# Patient Record
Sex: Male | Born: 1995 | Race: White | Hispanic: No | Marital: Single
Health system: Southern US, Community
[De-identification: ages and names within clinical notes are randomized; demographics above are authoritative.]

## PROBLEM LIST (undated history)

## (undated) ENCOUNTER — Emergency Department (HOSPITAL_COMMUNITY): Admission: EM | Payer: No Typology Code available for payment source | Source: Home / Self Care

## (undated) DIAGNOSIS — F419 Anxiety disorder, unspecified: Secondary | ICD-10-CM

## (undated) DIAGNOSIS — R9431 Abnormal electrocardiogram [ECG] [EKG]: Secondary | ICD-10-CM

## (undated) HISTORY — PX: CIRCUMCISION: SHX1350

## (undated) HISTORY — PX: BACK SURGERY: SHX140

## (undated) HISTORY — DX: Abnormal electrocardiogram (ECG) (EKG): R94.31

## (undated) HISTORY — DX: Anxiety disorder, unspecified: F41.9

---
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
## 2015-05-19 ENCOUNTER — Ambulatory Visit
Admission: RE | Admit: 2015-05-19 | Discharge: 2015-05-19 | Disposition: A | Discharge status: Home / Self Care | Payer: 59 | Source: Ambulatory Visit | Attending: Nurse Practitioner | Admitting: Nurse Practitioner

## 2015-05-19 ENCOUNTER — Other Ambulatory Visit: Payer: Self-pay | Admitting: Nurse Practitioner

## 2015-05-19 DIAGNOSIS — J069 Acute upper respiratory infection, unspecified: Secondary | ICD-10-CM

## 2017-07-28 ENCOUNTER — Emergency Department (HOSPITAL_COMMUNITY)
Admission: EM | Admit: 2017-07-28 | Discharge: 2017-07-29 | Disposition: A | Discharge status: Home / Self Care | Payer: 59 | Attending: Emergency Medicine | Admitting: Emergency Medicine

## 2017-07-28 ENCOUNTER — Other Ambulatory Visit: Payer: Self-pay

## 2017-07-28 DIAGNOSIS — Y9389 Activity, other specified: Secondary | ICD-10-CM | POA: Diagnosis not present

## 2017-07-28 DIAGNOSIS — S29012A Strain of muscle and tendon of back wall of thorax, initial encounter: Secondary | ICD-10-CM | POA: Diagnosis not present

## 2017-07-28 DIAGNOSIS — S81011A Laceration without foreign body, right knee, initial encounter: Secondary | ICD-10-CM | POA: Diagnosis not present

## 2017-07-28 DIAGNOSIS — Y92410 Unspecified street and highway as the place of occurrence of the external cause: Secondary | ICD-10-CM | POA: Diagnosis not present

## 2017-07-28 DIAGNOSIS — S4992XA Unspecified injury of left shoulder and upper arm, initial encounter: Secondary | ICD-10-CM | POA: Insufficient documentation

## 2017-07-28 DIAGNOSIS — S81021A Laceration with foreign body, right knee, initial encounter: Secondary | ICD-10-CM | POA: Insufficient documentation

## 2017-07-28 DIAGNOSIS — Z23 Encounter for immunization: Secondary | ICD-10-CM | POA: Insufficient documentation

## 2017-07-28 DIAGNOSIS — T148XXA Other injury of unspecified body region, initial encounter: Secondary | ICD-10-CM

## 2017-07-28 DIAGNOSIS — S39012A Strain of muscle, fascia and tendon of lower back, initial encounter: Secondary | ICD-10-CM | POA: Diagnosis not present

## 2017-07-28 DIAGNOSIS — Y999 Unspecified external cause status: Secondary | ICD-10-CM | POA: Insufficient documentation

## 2017-07-28 DIAGNOSIS — S8991XA Unspecified injury of right lower leg, initial encounter: Secondary | ICD-10-CM | POA: Diagnosis present

## 2017-07-28 DIAGNOSIS — S299XXA Unspecified injury of thorax, initial encounter: Secondary | ICD-10-CM | POA: Diagnosis not present

## 2017-07-28 NOTE — ED Notes (Signed)
Bed: WA03 Expected date:  Expected time:  Means of arrival:  Comments: 

## 2017-07-28 NOTE — ED Notes (Signed)
Bed: WA06 Expected date:  Expected time:  Means of arrival:  Comments: 
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
## 2017-07-28 NOTE — ED Notes (Signed)
Bed: WA15 Expected date:  Expected time:  Means of arrival:  Comments: 

## 2017-07-29 ENCOUNTER — Emergency Department (HOSPITAL_COMMUNITY): Payer: 59

## 2017-07-29 DIAGNOSIS — S81021A Laceration with foreign body, right knee, initial encounter: Secondary | ICD-10-CM | POA: Diagnosis not present

## 2017-07-29 MED ORDER — CEPHALEXIN 500 MG PO CAPS
500.0000 mg | ORAL_CAPSULE | Freq: Once | ORAL | Status: AC
  Administered 2017-07-29: 500 mg via ORAL
  Filled 2017-07-29: qty 1

## 2017-07-29 MED ORDER — BACITRACIN ZINC 500 UNIT/GM EX OINT
TOPICAL_OINTMENT | Freq: Once | CUTANEOUS | Status: AC
  Administered 2017-07-29: 1 via TOPICAL

## 2017-07-29 MED ORDER — NAPROXEN 375 MG PO TABS
375.0000 mg | ORAL_TABLET | Freq: Two times a day (BID) | ORAL | 0 refills | Status: DC
Start: 2017-07-29 — End: 2019-07-08

## 2017-07-29 MED ORDER — LIDOCAINE-EPINEPHRINE (PF) 2 %-1:200000 IJ SOLN
10.0000 mL | Freq: Once | INTRAMUSCULAR | Status: AC
  Administered 2017-07-29: 10 mL via INTRADERMAL
  Filled 2017-07-29: qty 20

## 2017-07-29 MED ORDER — METHOCARBAMOL 500 MG PO TABS: 500.0000 mg | ORAL_TABLET | Freq: Two times a day (BID) | ORAL | 0 refills | Status: DC

## 2017-07-29 MED ORDER — BACITRACIN ZINC 500 UNIT/GM EX OINT
TOPICAL_OINTMENT | CUTANEOUS | Status: AC
  Administered 2017-07-29: 1 via TOPICAL
  Filled 2017-07-29: qty 0.9

## 2017-07-29 MED ORDER — HYDROCODONE-ACETAMINOPHEN 5-325 MG PO TABS
2.0000 | ORAL_TABLET | Freq: Once | ORAL | Status: AC
  Administered 2017-07-29: 2 via ORAL
  Filled 2017-07-29: qty 2

## 2017-07-29 MED ORDER — METHOCARBAMOL 500 MG PO TABS
500.0000 mg | ORAL_TABLET | Freq: Once | ORAL | Status: AC
  Administered 2017-07-29: 500 mg via ORAL
  Filled 2017-07-29: qty 1

## 2017-07-29 MED ORDER — CEPHALEXIN 500 MG PO CAPS: 500.0000 mg | ORAL_CAPSULE | Freq: Three times a day (TID) | ORAL | 0 refills | Status: AC

## 2017-07-29 MED ORDER — TETANUS-DIPHTH-ACELL PERTUSSIS 5-2.5-18.5 LF-MCG/0.5 IM SUSP
0.5000 mL | Freq: Once | INTRAMUSCULAR | Status: AC
  Administered 2017-07-29: 0.5 mL via INTRAMUSCULAR
  Filled 2017-07-29: qty 0.5

## 2017-07-29 MED ORDER — KETOROLAC TROMETHAMINE 60 MG/2ML IM SOLN
60.0000 mg | Freq: Once | INTRAMUSCULAR | Status: AC
  Administered 2017-07-29: 60 mg via INTRAMUSCULAR
  Filled 2017-07-29: qty 2
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
## 2017-07-29 NOTE — ED Notes (Signed)
Pt requesting tums. PA made aware

## 2017-07-29 NOTE — ED Notes (Signed)
Bacitracin applied to rt knee. Petroleum gauze applied with gauze wrap.

## 2017-07-29 NOTE — ED Notes (Signed)
PA made aware of pt pain remaining unchanged

## 2017-10-20 ENCOUNTER — Ambulatory Visit: Payer: Self-pay | Admitting: Podiatry

## 2019-05-28 ENCOUNTER — Emergency Department (HOSPITAL_COMMUNITY): Payer: 59

## 2019-05-28 ENCOUNTER — Encounter (HOSPITAL_COMMUNITY): Payer: Self-pay | Admitting: *Deleted

## 2019-05-28 ENCOUNTER — Emergency Department (HOSPITAL_COMMUNITY)
Admission: EM | Admit: 2019-05-28 | Discharge: 2019-05-29 | Disposition: A | Discharge status: Home / Self Care | Payer: 59 | Attending: Emergency Medicine | Admitting: Emergency Medicine

## 2019-05-28 DIAGNOSIS — S060X9A Concussion with loss of consciousness of unspecified duration, initial encounter: Secondary | ICD-10-CM

## 2019-05-28 DIAGNOSIS — F17200 Nicotine dependence, unspecified, uncomplicated: Secondary | ICD-10-CM | POA: Diagnosis not present

## 2019-05-28 DIAGNOSIS — S3991XA Unspecified injury of abdomen, initial encounter: Secondary | ICD-10-CM | POA: Diagnosis not present

## 2019-05-28 DIAGNOSIS — Y939 Activity, unspecified: Secondary | ICD-10-CM | POA: Insufficient documentation

## 2019-05-28 DIAGNOSIS — S0101XA Laceration without foreign body of scalp, initial encounter: Secondary | ICD-10-CM

## 2019-05-28 DIAGNOSIS — Y92410 Unspecified street and highway as the place of occurrence of the external cause: Secondary | ICD-10-CM | POA: Insufficient documentation

## 2019-05-28 DIAGNOSIS — Y999 Unspecified external cause status: Secondary | ICD-10-CM | POA: Diagnosis not present

## 2019-05-28 DIAGNOSIS — S8012XA Contusion of left lower leg, initial encounter: Secondary | ICD-10-CM

## 2019-05-28 DIAGNOSIS — S299XXA Unspecified injury of thorax, initial encounter: Secondary | ICD-10-CM | POA: Insufficient documentation

## 2019-05-28 DIAGNOSIS — S298XXA Other specified injuries of thorax, initial encounter: Secondary | ICD-10-CM

## 2019-05-28 DIAGNOSIS — S0990XA Unspecified injury of head, initial encounter: Secondary | ICD-10-CM | POA: Diagnosis present

## 2019-05-28 LAB — CBC
HCT: 44.3 % (ref 39.0–52.0)
Hemoglobin: 15 g/dL (ref 13.0–17.0)
MCH: 30.1 pg (ref 26.0–34.0)
MCHC: 33.9 g/dL (ref 30.0–36.0)
MCV: 89 fL (ref 80.0–100.0)
Platelets: 289 10*3/uL (ref 150–400)
RBC: 4.98 MIL/uL (ref 4.22–5.81)
RDW: 12 % (ref 11.5–15.5)
WBC: 10.6 10*3/uL — ABNORMAL HIGH (ref 4.0–10.5)
nRBC: 0 % (ref 0.0–0.2)

## 2019-05-28 LAB — PROTIME-INR
INR: 1 (ref 0.8–1.2)
Prothrombin Time: 13.5 seconds (ref 11.4–15.2)

## 2019-05-28 LAB — I-STAT BETA HCG BLOOD, ED (MC, WL, AP ONLY): I-stat hCG, quantitative: <5 m[IU]/mL (ref <5)

## 2019-05-28 LAB — SAMPLE TO BLOOD BANK

## 2019-05-28 MED ORDER — TETANUS-DIPHTH-ACELL PERTUSSIS 5-2.5-18.5 LF-MCG/0.5 IM SUSP
0.5000 mL | Freq: Once | INTRAMUSCULAR | Status: AC
  Administered 2019-05-28: 0.5 mL via INTRAMUSCULAR

## 2019-05-28 MED ORDER — FENTANYL CITRATE (PF) 100 MCG/2ML IJ SOLN
100.0000 ug | Freq: Once | INTRAMUSCULAR | Status: AC
  Administered 2019-05-28: 100 ug via INTRAVENOUS
  Filled 2019-05-28: qty 2

## 2019-05-28 MED ORDER — SODIUM CHLORIDE 0.9 % IV BOLUS (SEPSIS)
2000.0000 mL | Freq: Once | INTRAVENOUS | Status: AC
  Administered 2019-05-28: 2000 mL via INTRAVENOUS
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
## 2019-05-28 NOTE — ED Notes (Signed)
P;ortable xrays taken on arrival  Pt remains alert and oriented  Skin warm

## 2019-05-28 NOTE — ED Notes (Signed)
gpd at the bedside.  
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
## 2019-05-28 NOTE — ED Notes (Signed)
Dillard Cannon mother [phone] looking for an update on the patient
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
## 2019-05-29 ENCOUNTER — Encounter: Payer: Self-pay | Admitting: Neurology

## 2019-05-29 ENCOUNTER — Encounter (HOSPITAL_COMMUNITY): Payer: Self-pay | Admitting: Radiology

## 2019-05-29 ENCOUNTER — Emergency Department (HOSPITAL_COMMUNITY): Payer: 59

## 2019-05-29 LAB — COMPREHENSIVE METABOLIC PANEL
ALT: 37 U/L (ref 0–44)
AST: 29 U/L (ref 15–41)
Albumin: 4.6 g/dL (ref 3.5–5.0)
Alkaline Phosphatase: 57 U/L (ref 38–126)
Anion gap: 13 (ref 5–15)
BUN: 12 mg/dL (ref 6–20)
CO2: 22 mmol/L (ref 22–32)
Calcium: 9.5 mg/dL (ref 8.9–10.3)
Chloride: 105 mmol/L (ref 98–111)
Creatinine, Ser: 1.04 mg/dL (ref 0.61–1.24)
GFR calc Af Amer: >60 mL/min (ref >60)
GFR calc non Af Amer: >60 mL/min (ref >60)
Glucose, Bld: 98 mg/dL (ref 70–99)
Potassium: 3.5 mmol/L (ref 3.5–5.1)
Sodium: 140 mmol/L (ref 135–145)
Total Bilirubin: 0.8 mg/dL (ref 0.3–1.2)
Total Protein: 7.2 g/dL (ref 6.5–8.1)

## 2019-05-29 LAB — ETHANOL: Alcohol, Ethyl (B): 86 mg/dL — ABNORMAL HIGH (ref <10)

## 2019-05-29 LAB — LACTIC ACID, PLASMA: Lactic Acid, Venous: 2.6 mmol/L (ref 0.5–1.9)

## 2019-05-29 LAB — CDS SEROLOGY

## 2019-05-29 MED ORDER — HYDROCODONE-ACETAMINOPHEN 5-325 MG PO TABS
2.0000 | ORAL_TABLET | Freq: Once | ORAL | Status: AC
  Administered 2019-05-29: 2 via ORAL
  Filled 2019-05-29: qty 2

## 2019-05-29 MED ORDER — LIDOCAINE-EPINEPHRINE (PF) 2 %-1:200000 IJ SOLN
20.0000 mL | Freq: Once | INTRAMUSCULAR | Status: AC
  Administered 2019-05-29: 20 mL
  Filled 2019-05-29: qty 20

## 2019-05-29 MED ORDER — HYDROCODONE-ACETAMINOPHEN 5-325 MG PO TABS: 1.0000 | ORAL_TABLET | Freq: Four times a day (QID) | ORAL | 0 refills | Status: DC | PRN

## 2019-05-29 MED ORDER — IOHEXOL 300 MG/ML  SOLN
100.0000 mL | Freq: Once | INTRAMUSCULAR | Status: AC | PRN
  Administered 2019-05-29: 100 mL via INTRAVENOUS

## 2019-05-29 NOTE — Discharge Instructions (Signed)
Be sure to keep the left leg elevated, and apply ice liberally to the swelling of the left leg If there is any worsening pain in the left leg, numbness or discoloration to your left foot in the next 2 to 3 days please return to the ER  If you have any worsening headache, vomiting, dizziness, confusion, change in speech in the next 12 hours please return to ER immediately
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
## 2019-05-29 NOTE — ED Notes (Signed)
Pt's wounds have been irrigated and cleaned.

## 2019-05-29 NOTE — ED Notes (Signed)
Pt returned from c-t edp at  The bedside

## 2019-05-29 NOTE — Progress Notes (Signed)
Orthopedic Tech Progress Note Patient Details:  Dustin Conner November 22, 1995 [id number]  Ortho Devices Type of Ortho Device: Crutches Ortho Device/Splint Interventions: Ordered, Application, Adjustment   Post Interventions Patient Tolerated: Well Instructions Provided: Care of device, Adjustment of device   Trinna Post 05/29/2019, 3:59 AM

## 2019-05-29 NOTE — ED Notes (Signed)
I found some of his piercing  Jewelry in a container attempted to call him  No answer when I called

## 2019-06-12 ENCOUNTER — Telehealth: Payer: Self-pay | Admitting: Internal Medicine

## 2019-06-12 NOTE — Telephone Encounter (Signed)
-----   Message from Kendrick Fries, [place] sent at 06/12/2019  3:41 PM EST ----- Pt would like to be contacted to reschedule his appointment 06/19/2019 w/Dr. Graciela Husbands.

## 2019-06-12 NOTE — Telephone Encounter (Signed)
Attempted to reschedule no ans no vm  

## 2019-06-19 ENCOUNTER — Institutional Professional Consult (permissible substitution): Payer: 59 | Admitting: Internal Medicine

## 2019-07-04 DIAGNOSIS — R000 Tachycardia, unspecified: Secondary | ICD-10-CM | POA: Insufficient documentation

## 2019-07-07 ENCOUNTER — Other Ambulatory Visit: Payer: Self-pay

## 2019-07-07 DIAGNOSIS — I471 Supraventricular tachycardia: Secondary | ICD-10-CM

## 2019-07-07 DIAGNOSIS — R000 Tachycardia, unspecified: Secondary | ICD-10-CM

## 2019-07-07 NOTE — Progress Notes (Signed)
Orders placed for Echo for SVT per Dr Graciela Husbands.

## 2019-07-08 ENCOUNTER — Other Ambulatory Visit: Payer: Self-pay

## 2019-07-08 ENCOUNTER — Ambulatory Visit (HOSPITAL_COMMUNITY): Payer: 59 | Attending: Internal Medicine

## 2019-07-08 ENCOUNTER — Ambulatory Visit: Payer: 59 | Admitting: Internal Medicine

## 2019-07-08 ENCOUNTER — Encounter: Payer: Self-pay | Admitting: Internal Medicine

## 2019-07-08 ENCOUNTER — Encounter (INDEPENDENT_AMBULATORY_CARE_PROVIDER_SITE_OTHER): Payer: Self-pay

## 2019-07-08 DIAGNOSIS — R000 Tachycardia, unspecified: Secondary | ICD-10-CM | POA: Diagnosis not present

## 2019-07-08 DIAGNOSIS — I471 Supraventricular tachycardia: Secondary | ICD-10-CM | POA: Diagnosis not present

## 2019-07-08 NOTE — Patient Instructions (Addendum)
Medication Instructions: Your physician has recommended you make the following change in your medication:  Add Metoprolol Tartrate 25mg  1 tablet by mouth twice daily -  #180 with 3 refills- hand written prescription given.  *If you need a refill on your cardiac medications before your next appointment, please call your pharmacy*  Lab Work: None ordered.  If you have labs (blood work) drawn today and your tests are completely normal, you will receive your results only by: MyChart Message (if you have MyChart) OR . A paper copy in the mail If you have any lab test that is abnormal or we need to change your treatment, we will call you to review the results.  Testing/Procedures: None ordered.   Follow-Up: At [facility], you and your health needs are our priority.  As part of our continuing mission to provide you with exceptional heart care, we have created designated Provider Care Teams.  These Care Teams include your primary Cardiologist (physician) and Advanced Practice Providers (APPs -  Physician Assistants and Nurse Practitioners) who all work together to provide you with the care you need, when you need it.  Your next appointment:    Tuesday 10/14/2019 at 145pm with Dr 12/14/2019  Phone number provided of [phone] to schedule appointment with [phone] May, Mental Health.
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
## 2019-07-11 ENCOUNTER — Ambulatory Visit (INDEPENDENT_AMBULATORY_CARE_PROVIDER_SITE_OTHER): Payer: 59 | Admitting: Psychiatry

## 2019-07-11 ENCOUNTER — Other Ambulatory Visit: Payer: Self-pay

## 2019-07-11 DIAGNOSIS — F411 Generalized anxiety disorder: Secondary | ICD-10-CM | POA: Diagnosis not present
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
## 2019-07-12 ENCOUNTER — Encounter: Payer: Self-pay | Admitting: Psychiatry

## 2019-07-25 ENCOUNTER — Ambulatory Visit: Payer: 59 | Admitting: Psychiatry

## 2019-07-25 ENCOUNTER — Encounter: Payer: Self-pay | Admitting: Psychiatry

## 2019-07-25 ENCOUNTER — Other Ambulatory Visit: Payer: Self-pay

## 2019-07-25 ENCOUNTER — Ambulatory Visit (INDEPENDENT_AMBULATORY_CARE_PROVIDER_SITE_OTHER): Payer: 59 | Admitting: Psychiatry

## 2019-07-25 VITALS — BP 129/76 | HR 93 | Ht 72.0 in | Wt 252.0 lb

## 2019-07-25 DIAGNOSIS — F411 Generalized anxiety disorder: Secondary | ICD-10-CM

## 2019-07-25 DIAGNOSIS — F4312 Post-traumatic stress disorder, chronic: Secondary | ICD-10-CM | POA: Diagnosis not present

## 2019-07-25 MED ORDER — ALPRAZOLAM 2 MG PO TABS: ORAL_TABLET | ORAL | 0 refills | Status: DC

## 2019-07-25 MED ORDER — SERTRALINE HCL 100 MG PO TABS: ORAL_TABLET | ORAL | 1 refills | Status: DC
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
## 2019-07-25 NOTE — Progress Notes (Signed)
      [facility] Counselor/Therapist Progress Note  Patient ID: Dustin Conner, MRN: [id number],    Date: 07/25/2019  Time Spent: 50 minutes   Treatment Type: Individual Therapy  Reported Symptoms: anxiety  Mental Status Exam:  Appearance:   Casual     Behavior:  Appropriate  Motor:  Normal  Speech/Language:   Clear and Coherent  Affect:  Appropriate  Mood:  anxious  Thought process:  normal  Thought content:    WNL  Sensory/Perceptual disturbances:    WNL  Orientation:  oriented to person, place, time/date and situation  Attention:  Good  Concentration:  Good  Memory:  WNL  Fund of knowledge:   Good  Insight:    Good  Judgment:   Good  Impulse Control:  Good   Risk Assessment: Danger to Self:  No Self-injurious Behavior: No Danger to Others: No Duty to Warn:no Physical Aggression / Violence:No  Access to Firearms a concern: No  Gang Involvement:No   Subjective: Today I started with eye-movement with the client around his overall anxiety.  His negative cognition was, "I am always ready."  He feels it in his chest, stomach and head.  His subjective units of distress is an 8+.  As we focused on this using eye-movement the client's anxiety shifted a small amount to around 7.  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  He also thought of other positive things versus anything anxiety provoking.  I asked the client where he thought his anxiety really started.  He acknowledged that it was most likely his dad.  As we used eye-movement around this the client's subjective units of distress did not shift at all. I asked the client how many dangerous circumstances he was in over the last week.  Surprisingly he answered "3".  One was when one of his friends wife died unexpectedly.  She was 26.  The more we processed these issues the more his subjective units of distress did not shift or change.  I then posited the idea that does the client think his anxiety  might be functional as a way to protect him?  He acknowledged that that very well could be true.  We continued to discuss the fact that having such a high level of anxiety interferes with his sleep.  He agrees with that but he feels that he always has to be prepared for an unexpected danger.  We will continue to work on processing through this and trying to shift the client's belief system to something that is much more healthy for his overall mental health.   Interventions: Motivational Interviewing, Solution-Oriented/Positive Psychology, [name] Desensitization and Reprocessing (EMDR) and Insight-Oriented  Diagnosis:   ICD-10-CM   1. Generalized anxiety disorder  F41.1     Plan: Positive self talk, follow-up with Dustin Chiquito, NP, exercise, positive self talk.  Dustin Conner, [name]

## 2019-08-05 ENCOUNTER — Ambulatory Visit (INDEPENDENT_AMBULATORY_CARE_PROVIDER_SITE_OTHER): Payer: 59 | Admitting: Psychiatry

## 2019-08-05 ENCOUNTER — Other Ambulatory Visit: Payer: Self-pay

## 2019-08-05 ENCOUNTER — Encounter: Payer: Self-pay | Admitting: Psychiatry

## 2019-08-05 DIAGNOSIS — F411 Generalized anxiety disorder: Secondary | ICD-10-CM
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
## 2019-08-29 ENCOUNTER — Ambulatory Visit (INDEPENDENT_AMBULATORY_CARE_PROVIDER_SITE_OTHER): Payer: 59 | Admitting: Psychiatry

## 2019-08-29 ENCOUNTER — Other Ambulatory Visit: Payer: Self-pay

## 2019-08-29 DIAGNOSIS — F411 Generalized anxiety disorder: Secondary | ICD-10-CM

## 2019-08-29 DIAGNOSIS — G4700 Insomnia, unspecified: Secondary | ICD-10-CM

## 2019-08-29 DIAGNOSIS — F4312 Post-traumatic stress disorder, chronic: Secondary | ICD-10-CM

## 2019-08-29 MED ORDER — TRAZODONE HCL 100 MG PO TABS: ORAL_TABLET | ORAL | 1 refills | Status: DC

## 2019-08-29 MED ORDER — ALPRAZOLAM 2 MG PO TABS: ORAL_TABLET | ORAL | 0 refills | Status: DC

## 2019-08-29 MED ORDER — SERTRALINE HCL 100 MG PO TABS: 150.0000 mg | ORAL_TABLET | Freq: Every day | ORAL | 1 refills | Status: DC

## 2019-08-29 NOTE — Progress Notes (Signed)
Dustin Conner [id number] 10-14-1995 24 y.o.  Subjective:   Patient ID:  Dustin Conner is a 24 y.o. (DOB 1995-10-02) male.  Chief Complaint:  Chief Complaint  Patient presents with  . Insomnia  . Anxiety  . Depression    HPI Dustin Conner presents to the office today for follow-up of anxiety, depression, and insomnia. He reports feeling "better" in terms of anxiety. He continues to "worry and stress all the time." He reports that he has had a few panic episodes, about once a week. Denies any change in frequency of panic attacks. He has been taking Xanax 1/2 tab prn panic. He reports that his BP has improved and HR remains somewhat elevated (90-130 with resting being 90-100). Some decrease in jaw clinching. Continued startle response and hyper-vigilance. Reports feeling "antsy."   He reports mood has improved and feels less irritable. He reports feeling less angry. Sadness is infrequent. Tries to fall asleep 10-11pm and awakens again at 2-3 pm and has trouble falling back to sleep. Denies nightmares. Appetite has been normal. Energy and motivation have improved. Concentration difficulties. Remains socially withdrawn. Occ passive death wishes. Denies SI.   He reports that he is not as worried as much in public.   He reports that he has been drinking significantly less.    Past Psychiatric Medication Trials: Metoprolol- Started in February for elevated heart rate. Xanax- Ineffective at 0.5 mg. He reports taking 2mg  tabs with decreased anxiety and insomnia.  Klonopin- Did not help with anxiety. Slept longer than usual. Sertraline- Briefly took probably at 100 mg.  Propranolol-ineffective  Melatonin-insurance Nyquil- felt "out of it."  Review of Systems:  Review of Systems  Musculoskeletal: Negative for gait problem.  Neurological: Positive for headaches.       Reports some pressure around his forehead. Not daily.  Occ tremor  Psychiatric/Behavioral:       Please  refer to HPI    Medications: I have reviewed the patient's current medications.  Current Outpatient Medications  Medication Sig Dispense Refill  . alprazolam (XANAX) 2 MG tablet Take 1/2-1 tab po qd prn anxiety 10 tablet 0  . ibuprofen (ADVIL) 400 MG tablet Take 400 mg by mouth every 6 (six) hours as needed.    . sertraline (ZOLOFT) 100 MG tablet Take 1.5 tablets (150 mg total) by mouth at bedtime. 45 tablet 1  . metoprolol tartrate (LOPRESSOR) 25 MG tablet Take 25 mg by mouth 2 (two) times daily.    . traZODone (DESYREL) 100 MG tablet Take 1/2-1 tablet po QHS prn insomnia 30 tablet 1   No current facility-administered medications for this visit.    Medication Side Effects: Headache  Allergies:  Allergies  Allergen Reactions  . Crab [Shellfish Allergy]     Past Medical History:  Diagnosis Date  . Abnormal electrocardiogram   . Anxiety     Family History  Problem Relation Age of Onset  . Migraines Mother   . ADD / ADHD Mother        Not being treated  . ADD / ADHD Brother        Bother brothers have ADHD and are not being treated with medication  . Bipolar disorder Brother   . Alcohol abuse Paternal Great-grandfather   . Alcohol abuse Paternal Great-grandmother     Social History   Socioeconomic History  . Marital status: Single    Spouse name: Not on file  . Number of children: Not on file  . Years of  education: Not on file  . Highest education level: Not on file  Occupational History  . Not on file  Tobacco Use  . Smoking status: Current Some Day Smoker  . Smokeless tobacco: Current User    Types: Snuff  Substance and Sexual Activity  . Alcohol use: Yes    Alcohol/week: 30.0 standard drinks    Types: 30 Cans of beer per week    Comment: The client will also consume more the a fifth of liquor/ week to manage his anxiety  . Drug use: Not Currently    Types: Marijuana  . Sexual activity: Not on file  Other Topics Concern  . Not on file  Social History  Narrative   ** Merged History Encounter **       Social Determinants of Health   Financial Resource Strain:   . Difficulty of Paying Living Expenses:   Food Insecurity:   . Worried About Charity fundraiser in the Last Year:   . Arboriculturist in the Last Year:   Transportation Needs:   . Film/video editor (Medical):   Marland Kitchen Lack of Transportation (Non-Medical):   Physical Activity:   . Days of Exercise per Week:   . Minutes of Exercise per Session:   Stress:   . Feeling of Stress :   Social Connections:   . Frequency of Communication with Friends and Family:   . Frequency of Social Gatherings with Friends and Family:   . Attends Religious Services:   . Active Member of Clubs or Organizations:   . Attends Archivist Meetings:   Marland Kitchen Marital Status:   Intimate Partner Violence:   . Fear of Current or Ex-Partner:   . Emotionally Abused:   Marland Kitchen Physically Abused:   . Sexually Abused:     Past Medical History, Surgical history, Social history, and Family history were reviewed and updated as appropriate.   Please see review of systems for further details on the patient's review from today.   Objective:   Physical Exam:  There were no vitals taken for this visit.  Physical Exam  Lab Review:     Component Value Date/Time   NA 140 05/28/2019 2314   K 3.5 05/28/2019 2314   CL 105 05/28/2019 2314   CO2 22 05/28/2019 2314   GLUCOSE 98 05/28/2019 2314   BUN 12 05/28/2019 2314   CREATININE 1.04 05/28/2019 2314   CALCIUM 9.5 05/28/2019 2314   PROT 7.2 05/28/2019 2314   ALBUMIN 4.6 05/28/2019 2314   AST 29 05/28/2019 2314   ALT 37 05/28/2019 2314   ALKPHOS 57 05/28/2019 2314   BILITOT 0.8 05/28/2019 2314   GFRNONAA >60 05/28/2019 2314   GFRAA >60 05/28/2019 2314       Component Value Date/Time   WBC 10.6 (H) 05/28/2019 2314   RBC 4.98 05/28/2019 2314   HGB 15.0 05/28/2019 2314   HCT 44.3 05/28/2019 2314   PLT 289 05/28/2019 2314   MCV 89.0 05/28/2019  2314   MCH 30.1 05/28/2019 2314   MCHC 33.9 05/28/2019 2314   RDW 12.0 05/28/2019 2314    No results found for: POCLITH, LITHIUM   No results found for: PHENYTOIN, PHENOBARB, VALPROATE, CBMZ   .res Assessment: Plan:   Continue sertraline 150 mg daily for anxiety and depression since patient reports that sertraline has been effective for mood and anxiety signs and symptoms.  Will avoid increasing dose at this time since patient has been having headaches which may  be related to sertraline.  Advised patient to notify provider if headaches worsen. Discussed treatment options for insomnia and discussed potential benefits, risks, and side effects of trazodone for insomnia.  Patient agrees to trial of trazodone.  Will start trazodone 100 mg 1/2 to 1 tablet at bedtime as needed for insomnia.  Advised patient to contact office if trazodone is not effective, and then may consider trial of Dayvigo to improve insomnia. Continue Xanax as needed for acute anxiety. Recommend continuing psychotherapy with Sherron Monday, LC [facility]. Patient follow-up with this provider in 4 weeks or sooner if clinically indicated. Patient advised to contact office with any questions, adverse effects, or acute worsening in signs and symptoms.  Vincente was seen today for insomnia, anxiety and depression.  Diagnoses and all orders for this visit:  Insomnia, unspecified type -     traZODone (DESYREL) 100 MG tablet; Take 1/2-1 tablet po QHS prn insomnia  Generalized anxiety disorder -     sertraline (ZOLOFT) 100 MG tablet; Take 1.5 tablets (150 mg total) by mouth at bedtime. -     alprazolam (XANAX) 2 MG tablet; Take 1/2-1 tab po qd prn anxiety  Chronic post-traumatic stress disorder (PTSD) -     sertraline (ZOLOFT) 100 MG tablet; Take 1.5 tablets (150 mg total) by mouth at bedtime. -     alprazolam (XANAX) 2 MG tablet; Take 1/2-1 tab po qd prn anxiety     Please see After Visit Summary for patient specific  instructions.  Future Appointments  Date Time Provider Department Center  09/26/2019 11:00 AM [name], PMHNP [facility] None  10/14/2019  1:45 PM Duke Salvia, MD [id number]    No orders of the defined types were placed in this encounter.   -------------------------------

## 2019-08-30 ENCOUNTER — Encounter: Payer: Self-pay | Admitting: Psychiatry

## 2019-09-26 ENCOUNTER — Encounter: Payer: Self-pay | Admitting: Psychiatry

## 2019-09-26 ENCOUNTER — Ambulatory Visit (INDEPENDENT_AMBULATORY_CARE_PROVIDER_SITE_OTHER): Payer: 59 | Admitting: Psychiatry

## 2019-09-26 ENCOUNTER — Other Ambulatory Visit: Payer: Self-pay

## 2019-09-26 VITALS — BP 113/79 | HR 77

## 2019-09-26 DIAGNOSIS — F4312 Post-traumatic stress disorder, chronic: Secondary | ICD-10-CM

## 2019-09-26 DIAGNOSIS — G4700 Insomnia, unspecified: Secondary | ICD-10-CM

## 2019-09-26 DIAGNOSIS — F411 Generalized anxiety disorder: Secondary | ICD-10-CM | POA: Diagnosis not present

## 2019-09-26 MED ORDER — ALPRAZOLAM 2 MG PO TABS: ORAL_TABLET | ORAL | 1 refills | Status: DC

## 2019-09-26 MED ORDER — SERTRALINE HCL 100 MG PO TABS: 200.0000 mg | ORAL_TABLET | Freq: Every day | ORAL | 0 refills | Status: DC

## 2019-09-26 MED ORDER — DAYVIGO 5 MG PO TABS: 5.0000 mg | ORAL_TABLET | Freq: Every day | ORAL | 0 refills | Status: DC

## 2019-09-26 NOTE — Progress Notes (Signed)
Dustin Conner [id number] 01-16-1996 24 y.o.  Subjective:   Patient ID:  Dustin Conner is a 24 y.o. (DOB June 12, 1995) male.  Chief Complaint:  Chief Complaint  Patient presents with  . Anxiety  . Insomnia  . Depression    HPI BOEN STERBENZ presents to the office today for follow-up of anxiety, depression, and insomnia. He reports that anxiety has been better. Reports that his partner left and moved to [place] and now pt has had more work. He reports that he is mainly focused on work and not worrying about other things. He reports feeling "calmer... my body feels more relaxed." He reports that his HR is increased about 1-2 times a day. He will also notice shortness of breath, shaking, and sweating. He reports that he has had a couple of panic attacks and has been able to go to his room and calm down. Panic attacks are less. Some jaw clinching and is noticing this less. He reports that he continues to feel antsy. He continues to have some intrusive memories. He reports that his mood is less sad than it was previously. Reports that his mood has not been persistently sad. He reports that he has been less irritable "but sill get bothered by a lot." Sleeping about 4-6 hours a night. He reports recurrent dreams and that dreams build on one another. Appetite has been normal. Energy has been low with increased work. Motivation has "been normal." Denies difficulty with concentration other than occasion distractions. Remains socially withdrawn. Occ passive death wishes. Denies SI.   He reports that his HR stays around 110.  He reports that ETOH use is less and is mainly on the weekends. No longer drinking every night.   Traveling frequently with work.   Past Psychiatric Medication Trials: Metoprolol- Started in February for elevated heart rate. Xanax- Ineffective at 0.5 mg. He reports taking 2mg  tabs with decreased anxiety and insomnia.  Klonopin- Did not help with anxiety. Slept  longer than usual. Sertraline- Briefly took probably at 100 mg.  Propranolol-ineffective  Melatonin-insurance Nyquil- felt "out of it." Trazodone- Caused severe heartburn.  Review of Systems:  Review of Systems  Gastrointestinal:       Heartburn resolved after stopping Trazodone  Musculoskeletal: Positive for arthralgias and back pain. Negative for gait problem.  Neurological: Positive for tremors.  Psychiatric/Behavioral:       Please refer to HPI    Medications: I have reviewed the patient's current medications.  Current Outpatient Medications  Medication Sig Dispense Refill  . alprazolam (XANAX) 2 MG tablet Take 1/2-1 tab po qd prn anxiety 10 tablet 1  . ibuprofen (ADVIL) 400 MG tablet Take 400 mg by mouth every 6 (six) hours as needed.    . metoprolol tartrate (LOPRESSOR) 25 MG tablet Take 25 mg by mouth 2 (two) times daily.    . sertraline (ZOLOFT) 100 MG tablet Take 2 tablets (200 mg total) by mouth at bedtime. 180 tablet 0  . Lemborexant (DAYVIGO) 5 MG TABS Take 5 mg by mouth at bedtime. Can increase to 10 mg po QHS after 3-5 days if 5 mg is ineffective 10 tablet 0   No current facility-administered medications for this visit.    Medication Side Effects: Other: Heartburn  Allergies:  Allergies  Allergen Reactions  . Crab [Shellfish Allergy]     Past Medical History:  Diagnosis Date  . Abnormal electrocardiogram   . Anxiety     Family History  Problem Relation Age of Onset  .  Migraines Mother   . ADD / ADHD Mother        Not being treated  . ADD / ADHD Brother        Bother brothers have ADHD and are not being treated with medication  . Bipolar disorder Brother   . Alcohol abuse Paternal Great-grandfather   . Alcohol abuse Paternal Great-grandmother     Social History   Socioeconomic History  . Marital status: Single    Spouse name: Not on file  . Number of children: Not on file  . Years of education: Not on file  . Highest education level: Not on  file  Occupational History  . Not on file  Tobacco Use  . Smoking status: Current Some Day Smoker  . Smokeless tobacco: Current User    Types: Snuff  Substance and Sexual Activity  . Alcohol use: Yes    Alcohol/week: 30.0 standard drinks    Types: 30 Cans of beer per week    Comment: The client will also consume more the a fifth of liquor/ week to manage his anxiety  . Drug use: Not Currently    Types: Marijuana  . Sexual activity: Not on file  Other Topics Concern  . Not on file  Social History Narrative   ** Merged History Encounter **       Social Determinants of Health   Financial Resource Strain:   . Difficulty of Paying Living Expenses:   Food Insecurity:   . Worried About Programme researcher, broadcasting/film/video in the Last Year:   . Barista in the Last Year:   Transportation Needs:   . Freight forwarder (Medical):   Marland Kitchen Lack of Transportation (Non-Medical):   Physical Activity:   . Days of Exercise per Week:   . Minutes of Exercise per Session:   Stress:   . Feeling of Stress :   Social Connections:   . Frequency of Communication with Friends and Family:   . Frequency of Social Gatherings with Friends and Family:   . Attends Religious Services:   . Active Member of Clubs or Organizations:   . Attends Banker Meetings:   Marland Kitchen Marital Status:   Intimate Partner Violence:   . Fear of Current or Ex-Partner:   . Emotionally Abused:   Marland Kitchen Physically Abused:   . Sexually Abused:     Past Medical History, Surgical history, Social history, and Family history were reviewed and updated as appropriate.   Please see review of systems for further details on the patient's review from today.   Objective:   Physical Exam:  BP 113/79   Pulse 77   Physical Exam Constitutional:      General: He is not in acute distress. Musculoskeletal:        General: No deformity.  Neurological:     Mental Status: He is alert and oriented to person, place, and time.      Coordination: Coordination normal.  Psychiatric:        Attention and Perception: Attention and perception normal. He does not perceive auditory or visual hallucinations.        Mood and Affect: Mood is anxious and depressed. Affect is not labile, blunt, angry or inappropriate.        Speech: Speech normal.        Behavior: Behavior normal.        Thought Content: Thought content normal. Thought content is not paranoid or delusional. Thought content does not include  homicidal or suicidal ideation. Thought content does not include homicidal or suicidal plan.        Cognition and Memory: Cognition and memory normal.        Judgment: Judgment normal.     Comments: Insight intact      Lab Review:     Component Value Date/Time   NA 140 05/28/2019 2314   K 3.5 05/28/2019 2314   CL 105 05/28/2019 2314   CO2 22 05/28/2019 2314   GLUCOSE 98 05/28/2019 2314   BUN 12 05/28/2019 2314   CREATININE 1.04 05/28/2019 2314   CALCIUM 9.5 05/28/2019 2314   PROT 7.2 05/28/2019 2314   ALBUMIN 4.6 05/28/2019 2314   AST 29 05/28/2019 2314   ALT 37 05/28/2019 2314   ALKPHOS 57 05/28/2019 2314   BILITOT 0.8 05/28/2019 2314   GFRNONAA >60 05/28/2019 2314   GFRAA >60 05/28/2019 2314       Component Value Date/Time   WBC 10.6 (H) 05/28/2019 2314   RBC 4.98 05/28/2019 2314   HGB 15.0 05/28/2019 2314   HCT 44.3 05/28/2019 2314   PLT 289 05/28/2019 2314   MCV 89.0 05/28/2019 2314   MCH 30.1 05/28/2019 2314   MCHC 33.9 05/28/2019 2314   RDW 12.0 05/28/2019 2314    No results found for: POCLITH, LITHIUM   No results found for: PHENYTOIN, PHENOBARB, VALPROATE, CBMZ   .res Assessment: Plan:   Discussed potential benefits, risks, and side effects of increase in Sertraline since pt has had some partial improvement in mood and anxiety s/s with lower doses of Sertraline. Pt agrees to increase in Sertraline. Discussed potential benefits, risks, and and side effects of Dayvigo for insomnia.  Patient  agrees to trial of Dayvigo.  Patient provided with Dayvigo 5 mg samples and advised to take 1 tablet by mouth at bedtime, and to increase to 2 tabs after 3 to 5 days if 5 mg dose is ineffective.  Patient advised to contact office if Dayvigo is effective and to request a script for the dose that is effective. Continue Xanax prn severe anxiety/panic.  Pt to f/u in 4 weeks or sooner if clinically indicated.  Patient advised to contact office with any questions, adverse effects, or acute worsening in signs and symptoms.   Quamaine was seen today for anxiety, insomnia and depression.  Diagnoses and all orders for this visit:  Insomnia, unspecified type -     Lemborexant (DAYVIGO) 5 MG TABS; Take 5 mg by mouth at bedtime. Can increase to 10 mg po QHS after 3-5 days if 5 mg is ineffective  Generalized anxiety disorder -     sertraline (ZOLOFT) 100 MG tablet; Take 2 tablets (200 mg total) by mouth at bedtime. -     alprazolam (XANAX) 2 MG tablet; Take 1/2-1 tab po qd prn anxiety  Chronic post-traumatic stress disorder (PTSD) -     sertraline (ZOLOFT) 100 MG tablet; Take 2 tablets (200 mg total) by mouth at bedtime. -     alprazolam (XANAX) 2 MG tablet; Take 1/2-1 tab po qd prn anxiety     Please see After Visit Summary for patient specific instructions.  Future Appointments  Date Time Provider [place]  10/14/2019  1:45 PM Deboraha Sprang, MD [facility]  10/31/2019 10:00 AM Thayer Headings, PMHNP [facility] None    No orders of the defined types were placed in this encounter.   -------------------------------

## 2019-10-14 ENCOUNTER — Ambulatory Visit: Payer: 59 | Admitting: Internal Medicine
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
## 2019-10-24 ENCOUNTER — Ambulatory Visit: Payer: 59 | Admitting: Physician Assistant

## 2019-10-25 ENCOUNTER — Other Ambulatory Visit: Payer: Self-pay | Admitting: Psychiatry

## 2019-10-25 DIAGNOSIS — G4700 Insomnia, unspecified: Secondary | ICD-10-CM

## 2019-10-31 ENCOUNTER — Other Ambulatory Visit: Payer: Self-pay

## 2019-10-31 ENCOUNTER — Ambulatory Visit (INDEPENDENT_AMBULATORY_CARE_PROVIDER_SITE_OTHER): Payer: 59 | Admitting: Psychiatry

## 2019-10-31 ENCOUNTER — Encounter: Payer: Self-pay | Admitting: Psychiatry

## 2019-10-31 DIAGNOSIS — F411 Generalized anxiety disorder: Secondary | ICD-10-CM

## 2019-10-31 DIAGNOSIS — F4312 Post-traumatic stress disorder, chronic: Secondary | ICD-10-CM | POA: Diagnosis not present

## 2019-10-31 MED ORDER — SERTRALINE HCL 100 MG PO TABS: 200.0000 mg | ORAL_TABLET | Freq: Every day | ORAL | 0 refills | Status: DC

## 2019-10-31 NOTE — Progress Notes (Signed)
Dustin Conner [id number] 1995-05-20 24 y.o.  Subjective:   Patient ID:  Dustin Conner is a 24 y.o. (DOB 20-Dec-1995) male.  Chief Complaint:  Chief Complaint  Patient presents with  . Follow-up    h/o Anxiety, depression, and sleep disturbance    HPI Dustin Conner presents to the office today for follow-up of anxiety, depression, and insomnia. He reports that his anxiety has been beter with increase in Sertraline. Has some anxiety around people he does not know well. Denies any panic attacks in the last few weeks. Occasional chest tightness with anxiety and reports that this has decreased. Rare shortness of breath. He reports that sadness has been less. "I still get irritated, not as much." He continues to feel irritated about things that he thinks he shouldn't be as irritated about and has difficulty letting this go. Sleep has been "hit or miss." He reports that he was able to sleep with Dayvigo but was dragging the following day. He reports that sleep has improved without medication and is averaging about 6 hours a night. Denies nightmares. Reports that he has "strange dreams" and has a recurrent dream about developing property with a snake infestation. He reports that he does not have dreams every night. Appetite has been good. Now eating 2-3 times a day. Energy has been low due to long hours. Motivation has been good. He reports that he has some difficulty focusing due to juggling multiple things and that he is able to focus once he gets started on something. Has been enjoying some things. Denies SI.   Driving 6789 miles a week for work. Has not had time for hobbies or socializing.   He reports that he is rarely taking Xanax prn. He reports that he drinks some on the weekends.   Past Psychiatric Medication Trials: Metoprolol- Started in February for elevated heart rate. Xanax- Ineffective at 0.5 mg. He reports taking 2mg  tabs with decreased anxiety and insomnia.  Klonopin-  Did not help with anxiety. Slept longer than usual. Sertraline- Briefly took probably at 100 mg.  Propranolol-ineffective Melatonin-insurance Nyquil- felt "out of it." Trazodone- Caused severe heartburn.    Review of Systems:  Review of Systems  Cardiovascular: Negative for palpitations.  Musculoskeletal: Positive for arthralgias and back pain. Negative for gait problem.  Neurological: Negative for tremors.  Psychiatric/Behavioral:       Please refer to HPI    Medications: I have reviewed the patient's current medications.  Current Outpatient Medications  Medication Sig Dispense Refill  . alprazolam (XANAX) 2 MG tablet Take 1/2-1 tab po qd prn anxiety 10 tablet 1  . Aspirin-Salicylamide-Caffeine (BC HEADACHE POWDER PO) Take by mouth.    ibuprofen (ADVIL) 400 MG tablet Take 400 mg by mouth every 6 (six) hours as needed.    . metoprolol tartrate (LOPRESSOR) 25 MG tablet Take 25 mg by mouth 2 (two) times daily.    . sertraline (ZOLOFT) 100 MG tablet Take 2 tablets (200 mg total) by mouth at bedtime. 180 tablet 0   No current facility-administered medications for this visit.    Medication Side Effects: Other: Sexual side effects  Allergies:  Allergies  Allergen Reactions  . Crab [Shellfish Allergy]     Past Medical History:  Diagnosis Date  . Abnormal electrocardiogram   . Anxiety     Family History  Problem Relation Age of Onset  . Migraines Mother   . ADD / ADHD Mother        Not being  treated  . ADD / ADHD Brother        Bother brothers have ADHD and are not being treated with medication  . Bipolar disorder Brother   . Alcohol abuse Paternal Great-grandfather   . Alcohol abuse Paternal Great-grandmother     Social History   Socioeconomic History  . Marital status: Single    Spouse name: Not on file  . Number of children: Not on file  . Years of education: Not on file  . Highest education level: Not on file  Occupational History  . Not on file   Tobacco Use  . Smoking status: Current Some Day Smoker  . Smokeless tobacco: Current User    Types: Snuff  Substance and Sexual Activity  . Alcohol use: Yes    Comment: Reports drinking typically only on weekends.   . Drug use: Not Currently    Types: Marijuana  . Sexual activity: Not on file  Other Topics Concern  . Not on file  Social History Narrative   ** Merged History Encounter **       Social Determinants of Health   Financial Resource Strain:   . Difficulty of Paying Living Expenses:   Food Insecurity:   . Worried About Programme researcher, broadcasting/film/video in the Last Year:   . Barista in the Last Year:   Transportation Needs:   . Freight forwarder (Medical):   Marland Kitchen Lack of Transportation (Non-Medical):   Physical Activity:   . Days of Exercise per Week:   . Minutes of Exercise per Session:   Stress:   . Feeling of Stress :   Social Connections:   . Frequency of Communication with Friends and Family:   . Frequency of Social Gatherings with Friends and Family:   . Attends Religious Services:   . Active Member of Clubs or Organizations:   . Attends Banker Meetings:   Marland Kitchen Marital Status:   Intimate Partner Violence:   . Fear of Current or Ex-Partner:   . Emotionally Abused:   Marland Kitchen Physically Abused:   . Sexually Abused:     Past Medical History, Surgical history, Social history, and Family history were reviewed and updated as appropriate.   Please see review of systems for further details on the patient's review from today.   Objective:   Physical Exam:  BP 131/85   Pulse 73   Physical Exam Constitutional:      General: He is not in acute distress. Musculoskeletal:        General: No deformity.  Neurological:     Mental Status: He is alert and oriented to person, place, and time.     Coordination: Coordination normal.  Psychiatric:        Attention and Perception: Attention and perception normal. He does not perceive auditory or visual  hallucinations.        Mood and Affect: Mood normal. Mood is not anxious or depressed. Affect is not labile, blunt, angry or inappropriate.        Speech: Speech normal.        Behavior: Behavior normal.        Thought Content: Thought content normal. Thought content is not paranoid or delusional. Thought content does not include homicidal or suicidal ideation. Thought content does not include homicidal or suicidal plan.        Cognition and Memory: Cognition and memory normal.        Judgment: Judgment normal.  Comments: Insight intact     Lab Review:     Component Value Date/Time   NA 140 05/28/2019 2314   K 3.5 05/28/2019 2314   CL 105 05/28/2019 2314   CO2 22 05/28/2019 2314   GLUCOSE 98 05/28/2019 2314   BUN 12 05/28/2019 2314   CREATININE 1.04 05/28/2019 2314   CALCIUM 9.5 05/28/2019 2314   PROT 7.2 05/28/2019 2314   ALBUMIN 4.6 05/28/2019 2314   AST 29 05/28/2019 2314   ALT 37 05/28/2019 2314   ALKPHOS 57 05/28/2019 2314   BILITOT 0.8 05/28/2019 2314   GFRNONAA >60 05/28/2019 2314   GFRAA >60 05/28/2019 2314       Component Value Date/Time   WBC 10.6 (H) 05/28/2019 2314   RBC 4.98 05/28/2019 2314   HGB 15.0 05/28/2019 2314   HCT 44.3 05/28/2019 2314   PLT 289 05/28/2019 2314   MCV 89.0 05/28/2019 2314   MCH 30.1 05/28/2019 2314   MCHC 33.9 05/28/2019 2314   RDW 12.0 05/28/2019 2314    No results found for: POCLITH, LITHIUM   No results found for: PHENYTOIN, PHENOBARB, VALPROATE, CBMZ   .res Assessment: Plan:   Discussed sexual side effects on sertraline 200 mg daily and treatment options.  Discussed taking sertraline after sexual activity may help somewhat with sexual function.  Discussed option of lowering dose to 150 mg daily to decrease sexual side effects.  Patient reports that he will continue sertraline 200 mg daily at this time and consider dose reduction to 150 mg daily if sexual side effects do not improve.  Also encouraged patient to discuss with  cardiologist during upcoming appointment if there are any cardiac contraindications to taking a medication such as Viagra or Cialis to offset sexual side effects. Patient reports that his sleep has improved without medication and had excessive somnolence with Dayvigo, and we will therefore discontinue Dayvigo. Patient to follow-up in 3 months or sooner if clinically indicated. Patient advised to contact office with any questions, adverse effects, or acute worsening in signs and symptoms.  Fahim was seen today for follow-up.  Diagnoses and all orders for this visit:  Generalized anxiety disorder -     sertraline (ZOLOFT) 100 MG tablet; Take 2 tablets (200 mg total) by mouth at bedtime.  Chronic post-traumatic stress disorder (PTSD) -     sertraline (ZOLOFT) 100 MG tablet; Take 2 tablets (200 mg total) by mouth at bedtime.     Please see After Visit Summary for patient specific instructions.  Future Appointments  Date Time Provider [place]  11/20/2019  3:15 PM Deboraha Sprang, MD [facility]  01/30/2020 10:00 AM Thayer Headings, PMHNP [facility] None    No orders of the defined types were placed in this encounter.   -------------------------------

## 2019-11-20 ENCOUNTER — Telehealth (INDEPENDENT_AMBULATORY_CARE_PROVIDER_SITE_OTHER): Payer: 59 | Admitting: Internal Medicine

## 2019-11-20 ENCOUNTER — Other Ambulatory Visit: Payer: Self-pay

## 2019-11-20 ENCOUNTER — Telehealth: Payer: Self-pay

## 2019-11-20 VITALS — BP 134/86 | HR 111 | Ht 72.0 in | Wt 250.0 lb

## 2019-11-20 DIAGNOSIS — R000 Tachycardia, unspecified: Secondary | ICD-10-CM

## 2019-11-20 MED ORDER — METOPROLOL TARTRATE 50 MG PO TABS: 50.0000 mg | ORAL_TABLET | Freq: Two times a day (BID) | ORAL | 3 refills | Status: DC

## 2019-11-20 NOTE — Patient Instructions (Addendum)
Medication Instructions:  Your physician has recommended you make the following change in your medication:   Increase your Metoprolol to 50mg  - 1 tablet by mouth 2 times daily.  A new prescription has been sent in to pharmacy on file for you.  *If you need a refill on your cardiac medications before your next appointment, please call your pharmacy*   Lab Work: None ordered.  If you have labs (blood work) drawn today and your tests are completely normal, you will receive your results only by: MyChart Message (if you have MyChart) OR . A paper copy in the mail If you have any lab test that is abnormal or we need to change your treatment, we will call you to review the results.   Testing/Procedures: None ordered.    Follow-Up: At [facility], you and your health needs are our priority.  As part of our continuing mission to provide you with exceptional heart care, we have created designated Provider Care Teams.  These Care Teams include your primary Cardiologist (physician) and Advanced Practice Providers (APPs -  Physician Assistants and Nurse Practitioners) who all work together to provide you with the care you need, when you need it.  We recommend signing up for the patient portal called "MyChart".  Sign up information is provided on this After Visit Summary.  MyChart is used to connect with patients for Virtual Visits (Telemedicine).  Patients are able to view lab/test results, encounter notes, upcoming appointments, etc.  Non-urgent messages can be sent to your provider as well.   To learn more about what you can do with MyChart, go to [facility].    Your next appointment:   4 months.  You will receive a reminder letter to schedule  The format for your next appointment:   In person   Provider:   [email or web address]   Other Instructions:  Dr Graciela Husbands is considering starting you on Corlanor Please contact your insurance company to see what your coverage would be for  Corlanor 5mg  and let Graciela Husbands know.  Our office will assist with pre-authorization if needed.  AVS mailed to pt 11/21/2019

## 2019-11-20 NOTE — Progress Notes (Signed)
Electrophysiology TeleHealth Note   Due to national recommendations of social distancing due to COVID 19, an audio/video telehealth visit is felt to be most appropriate for this patient at this time.  See MyChart message from today for the patient's consent to telehealth for [facility].   Date:  11/20/2019   ID:  Dustin Conner, DOB [birth year], MRN [id number]  Location: patient's home  Provider location: [address] [place]  Evaluation Performed: Follow-up visit  PCP:  Francene Finders, MD  Cardiologist:   None  Electrophysiologist:  SK   Chief Complaint: tachycardia  History of Present Illness:    Dustin Conner is a 24 y.o. male who presents via audio/video conferencing for a telehealth visit today.  Since last being seen in our clinic for abnormal ECG and anxiety the patient reports heart rate still up but BP is better  Anxiety better on sertraline    The patient denies symptoms of fevers, chills, cough, or new SOB worrisome for COVID 19.   Past Medical History:  Diagnosis Date  . Abnormal electrocardiogram   . Anxiety     Past Surgical History:  Procedure Laterality Date  . BACK SURGERY    . CIRCUMCISION      Current Outpatient Medications  Medication Sig Dispense Refill  . alprazolam (XANAX) 2 MG tablet Take 1/2-1 tab po qd prn anxiety 10 tablet 1  . Aspirin-Salicylamide-Caffeine (BC HEADACHE POWDER PO) Take by mouth.    Marland Kitchen ibuprofen (ADVIL) 400 MG tablet Take 400 mg by mouth every 6 (six) hours as needed.    . metoprolol tartrate (LOPRESSOR) 25 MG tablet Take 25 mg by mouth 2 (two) times daily.    . sertraline (ZOLOFT) 100 MG tablet Take 2 tablets (200 mg total) by mouth at bedtime. 180 tablet 0   No current facility-administered medications for this visit.    Allergies:   Parke Simmers allergy]   Social History:  The patient  reports that he has been smoking. His smokeless tobacco use includes snuff. He reports current  alcohol use. He reports previous drug use. Drug: Marijuana.   Family History:  The patient's   family history includes ADD / ADHD in his brother and mother; Alcohol abuse in his paternal great-grandfather and paternal great-grandmother; Bipolar disorder in his brother; Migraines in his mother.   ROS:  Please see the history of present illness.   All other systems are personally reviewed and negative.    Exam:    Vital Signs:  BP 134/86   Pulse (!) 111   Ht 6' (1.829 m)   Wt 250 lb (113.4 kg)   BMI 33.91 kg/m        Labs/Other Tests and Data Reviewed:    Recent Labs: 05/28/2019: ALT 37; BUN 12; Creatinine, Ser 1.04; Hemoglobin 15.0; Platelets 289; Potassium 3.5; Sodium 140   Wt Readings from Last 3 Encounters:  11/20/19 250 lb (113.4 kg)  07/08/19 254 lb 3.2 oz (115.3 kg)  05/28/19 260 lb (117.9 kg)     Other studies personally reviewed: Additional studies/ records that were reviewed today include   ASSESSMENT & PLAN:    Abnormal ECG  Sinus tachycardia  Elevated blood pressure  Anxiety/anger/depression   BP better  HR unchanged  Anxiety better on sertraline  COVID 19 screen The patient denies symptoms of COVID 19 at this time.  The importance of social distancing was discussed today.    Follow-up:  69m  Current medicines are reviewed at length with the patient today.   The patient does not have concerns regarding his medicines.  The following changes were made today: increase metoprolol 50 bid Lets have preauth look to see about corlanor 5 bid   Labs/ tests ordered today include:   No orders of the defined types were placed in this encounter.   Future tests ( post COVID )     Patient Risk:  after full review of this patients clinical status, I feel that they are at moderate  risk at this time.  Today, I have spent 8 minutes with the patient with telehealth technology discussing the above.  Signed, Sherryl Manges, MD  11/20/2019 3:29 PM     [facility]  [facility] [address] [place] [address] [phone] (office) [phone] (fax)

## 2019-11-20 NOTE — Telephone Encounter (Signed)
  Patient Consent for Virtual Visit         Dustin Conner has provided verbal consent on 11/20/2019 for a virtual visit (video or telephone).   CONSENT FOR VIRTUAL VISIT FOR:  Dustin Conner  By participating in this virtual visit I agree to the following:  I hereby voluntarily request, consent and authorize [facility] and its employed or contracted physicians, physician assistants, nurse practitioners or other licensed health care professionals (the Practitioner), to provide me with telemedicine health care services (the "Services") as deemed necessary by the treating Practitioner. I acknowledge and consent to receive the Services by the Practitioner via telemedicine. I understand that the telemedicine visit will involve communicating with the Practitioner through live audiovisual communication technology and the disclosure of certain medical information by electronic transmission. I acknowledge that I have been given the opportunity to request an in-person assessment or other available alternative prior to the telemedicine visit and am voluntarily participating in the telemedicine visit.  I understand that I have the right to withhold or withdraw my consent to the use of telemedicine in the course of my care at any time, without affecting my right to future care or treatment, and that the Practitioner or I may terminate the telemedicine visit at any time. I understand that I have the right to inspect all information obtained and/or recorded in the course of the telemedicine visit and may receive copies of available information for a reasonable fee.  I understand that some of the potential risks of receiving the Services via telemedicine include:  Marland Kitchen Delay or interruption in medical evaluation due to technological equipment failure or disruption; . Information transmitted may not be sufficient (e.g. poor resolution of images) to allow for appropriate medical decision making by the  Practitioner; and/or  . In rare instances, security protocols could fail, causing a breach of personal health information.  Furthermore, I acknowledge that it is my responsibility to provide information about my medical history, conditions and care that is complete and accurate to the best of my ability. I acknowledge that Practitioner's advice, recommendations, and/or decision may be based on factors not within their control, such as incomplete or inaccurate data provided by me or distortions of diagnostic images or specimens that may result from electronic transmissions. I understand that the practice of medicine is not an exact science and that Practitioner makes no warranties or guarantees regarding treatment outcomes. I acknowledge that a copy of this consent can be made available to me via my patient portal [facility] MyChart), or I can request a printed copy by calling the office of [facility].    I understand that my insurance will be billed for this visit.   I have read or had this consent read to me. . I understand the contents of this consent, which adequately explains the benefits and risks of the Services being provided via telemedicine.  . I have been provided ample opportunity to ask questions regarding this consent and the Services and have had my questions answered to my satisfaction. . I give my informed consent for the services to be provided through the use of telemedicine in my medical care

## 2020-01-30 ENCOUNTER — Other Ambulatory Visit: Payer: Self-pay

## 2020-01-30 ENCOUNTER — Encounter: Payer: Self-pay | Admitting: Psychiatry

## 2020-01-30 ENCOUNTER — Ambulatory Visit (INDEPENDENT_AMBULATORY_CARE_PROVIDER_SITE_OTHER): Payer: 59 | Admitting: Psychiatry

## 2020-01-30 VITALS — BP 129/88 | HR 66

## 2020-01-30 DIAGNOSIS — F411 Generalized anxiety disorder: Secondary | ICD-10-CM | POA: Diagnosis not present

## 2020-01-30 DIAGNOSIS — F4312 Post-traumatic stress disorder, chronic: Secondary | ICD-10-CM

## 2020-01-30 DIAGNOSIS — G4700 Insomnia, unspecified: Secondary | ICD-10-CM | POA: Diagnosis not present

## 2020-01-30 MED ORDER — ALPRAZOLAM 2 MG PO TABS: ORAL_TABLET | ORAL | 5 refills | Status: AC

## 2020-01-30 MED ORDER — SERTRALINE HCL 100 MG PO TABS: 200.0000 mg | ORAL_TABLET | Freq: Every day | ORAL | 1 refills | Status: DC

## 2020-01-30 NOTE — Progress Notes (Signed)
Dustin Conner [id number] [birth year] 24 y.o.  Subjective:   Patient ID:  Dustin Conner is a 24 y.o. (DOB 23-Jan-1996) male.  Chief Complaint:  Chief Complaint  Patient presents with  . Follow-up    h/o Anxiety, Depression, and sleep disturbance    HPI Dustin Conner presents to the office today for follow-up of anxiety, depression, and insomnia. He reports that one of his best friends died from COVID Sep 09, 2022 after being hospitalized for about a month. Friend died on Agustin's birthday. Would see friend several times a week. Plans to see friend's dad this week. He reports that he is still in shock about friend's death. Friend's funeral is September 08, 2022. Anxiety has been fair.  He reports that he was doing better before his friend's death and mood was "pretty good." He reports having some panic attacks this week and otherwise 3 or 4 panic attacks since last visit.   Has been sleeping ok since this happened other than waking up earlier. Has had a few vivid, stress dreams. Feels tired and less motivated when it is overcast. He reports that energy and motivation has been good overall. 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 Likes smoking meats and feeding groups of people. Appetite has been good. Concentration is fair and can focus when he needs to. Denies SI.    He continues to travel frequently for his work. He is working 12-16 hours a day. His girlfriend is studying radiography.Typically doesn't drink Monday-Wed. May have 3-4 beers while watching football. Occ marijuana use.   He reports that ETOH use has been increased this week. He reports that he usually drinks more on Fridays and Saturdays.   Review of Systems:  Review of Systems  Cardiovascular: Positive for palpitations.       He reports that increased Metoprolol has helped with BP. He reports that pulse remains elevated  Musculoskeletal: Positive for arthralgias. Negative  for gait problem.  Neurological: Negative for tremors.  Psychiatric/Behavioral:       Please refer to HPI    Medications: I have reviewed the patient's current medications.  Current Outpatient Medications  Medication Sig Dispense Refill  . alprazolam (XANAX) 2 MG tablet Take 1/2-1 tab po qd prn anxiety 10 tablet 5  . ASHWAGANDHA PO Take by mouth.    . Aspirin-Salicylamide-Caffeine (BC HEADACHE POWDER PO) Take by mouth.    Marland Kitchen ibuprofen (ADVIL) 400 MG tablet Take 400 mg by mouth every 6 (six) hours as needed.    . metoprolol tartrate (LOPRESSOR) 50 MG tablet Take 1 tablet (50 mg total) by mouth 2 (two) times daily. 180 tablet 3  . sertraline (ZOLOFT) 100 MG tablet Take 2 tablets (200 mg total) by mouth at bedtime. 180 tablet 1   No current facility-administered medications for this visit.    Medication Side Effects: Other: Sexual side effects, occasional dizziness  Allergies:  Allergies  Allergen Reactions  . Crab [Shellfish Allergy]     Past Medical History:  Diagnosis Date  . Abnormal electrocardiogram   . Anxiety     Family History  Problem Relation Age of Onset  . Migraines Mother   . ADD / ADHD Mother        Not being treated  . ADD / ADHD Brother        Bother brothers have ADHD and are not being treated with medication  . Bipolar disorder Brother   . Alcohol abuse Paternal Great-grandfather   .  Alcohol abuse Paternal Great-grandmother     Social History   Socioeconomic History  . Marital status: Single    Spouse name: Not on file  . Number of children: Not on file  . Years of education: Not on file  . Highest education level: Not on file  Occupational History  . Not on file  Tobacco Use  . Smoking status: Current Some Day Smoker  . Smokeless tobacco: Current User    Types: Snuff  Substance and Sexual Activity  . Alcohol use: Yes    Comment: Reports drinking typically only on weekends.   . Drug use: Not Currently    Types: Marijuana  . Sexual  activity: Not on file  Other Topics Concern  . Not on file  Social History Narrative   ** Merged History Encounter **       Social Determinants of Health   Financial Resource Strain:   . Difficulty of Paying Living Expenses: Not on file  Food Insecurity:   . Worried About Programme researcher, broadcasting/film/video in the Last Year: Not on file  . Ran Out of Food in the Last Year: Not on file  Transportation Needs:   . Lack of Transportation (Medical): Not on file  . Lack of Transportation (Non-Medical): Not on file  Physical Activity:   . Days of Exercise per Week: Not on file  . Minutes of Exercise per Session: Not on file  Stress:   . Feeling of Stress : Not on file  Social Connections:   . Frequency of Communication with Friends and Family: Not on file  . Frequency of Social Gatherings with Friends and Family: Not on file  . Attends Religious Services: Not on file  . Active Member of Clubs or Organizations: Not on file  . Attends Banker Meetings: Not on file  . Marital Status: Not on file  Intimate Partner Violence:   . Fear of Current or Ex-Partner: Not on file  . Emotionally Abused: Not on file  . Physically Abused: Not on file  . Sexually Abused: Not on file    Past Medical History, Surgical history, Social history, and Family history were reviewed and updated as appropriate.   Please see review of systems for further details on the patient's review from today.   Objective:   Physical Exam:  BP 129/88   Pulse 66   Physical Exam Constitutional:      General: He is not in acute distress. Musculoskeletal:        General: No deformity.  Neurological:     Mental Status: He is alert and oriented to person, place, and time.     Coordination: Coordination normal.  Psychiatric:        Attention and Perception: Attention and perception normal. He does not perceive auditory or visual hallucinations.        Mood and Affect: Affect is not labile, blunt, angry or inappropriate.         Speech: Speech normal.        Behavior: Behavior normal.        Thought Content: Thought content normal. Thought content is not paranoid or delusional. Thought content does not include homicidal or suicidal ideation. Thought content does not include homicidal or suicidal plan.        Cognition and Memory: Cognition and memory normal.        Judgment: Judgment normal.     Comments: Insight intact Mood is appropriate to content.  Affect is congruent  Lab Review:     Component Value Date/Time   NA 140 05/28/2019 2314   K 3.5 05/28/2019 2314   CL 105 05/28/2019 2314   CO2 22 05/28/2019 2314   GLUCOSE 98 05/28/2019 2314   BUN 12 05/28/2019 2314   CREATININE 1.04 05/28/2019 2314   CALCIUM 9.5 05/28/2019 2314   PROT 7.2 05/28/2019 2314   ALBUMIN 4.6 05/28/2019 2314   AST 29 05/28/2019 2314   ALT 37 05/28/2019 2314   ALKPHOS 57 05/28/2019 2314   BILITOT 0.8 05/28/2019 2314   GFRNONAA >60 05/28/2019 2314   GFRAA >60 05/28/2019 2314       Component Value Date/Time   WBC 10.6 (H) 05/28/2019 2314   RBC 4.98 05/28/2019 2314   HGB 15.0 05/28/2019 2314   HCT 44.3 05/28/2019 2314   PLT 289 05/28/2019 2314   MCV 89.0 05/28/2019 2314   MCH 30.1 05/28/2019 2314   MCHC 33.9 05/28/2019 2314   RDW 12.0 05/28/2019 2314    No results found for: POCLITH, LITHIUM   No results found for: PHENYTOIN, PHENOBARB, VALPROATE, CBMZ   .res Assessment: Plan:   Discussed continuing current medications at this time. Discussed that he could consider decreasing Sertraline to 150 mg po qd to minimize sexual side effects. Discussed that he may not want to reduce medication at this time since he is grieving the loss of his best friend who died from COVID this week. Recommended waiting 1-2 months until acute grief has resolved and then considering dose reduction if side effects remain intolerable.  Continue Sertraline 150-200 mg po qd for anxiety and depression.  Continue Xanax prn anxiety.   Discussed considering resuming therapy with Sherron Monday, [facility] as needed.  Pt to follow-up in 6 months or sooner if clinically indicated.  Patient advised to contact office with any questions, adverse effects, or acute worsening in signs and symptoms.  Avyan was seen today for follow-up.  Diagnoses and all orders for this visit:  Generalized anxiety disorder -     sertraline (ZOLOFT) 100 MG tablet; Take 2 tablets (200 mg total) by mouth at bedtime. -     alprazolam (XANAX) 2 MG tablet; Take 1/2-1 tab po qd prn anxiety  Chronic post-traumatic stress disorder (PTSD) -     sertraline (ZOLOFT) 100 MG tablet; Take 2 tablets (200 mg total) by mouth at bedtime. -     alprazolam (XANAX) 2 MG tablet; Take 1/2-1 tab po qd prn anxiety  Insomnia, unspecified type     Please see After Visit Summary for patient specific instructions.  Future Appointments  Date Time Provider Department Center  07/30/2020 10:00 AM [name], PMHNP [facility] None    No orders of the defined types were placed in this encounter.   -------------------------------

## 2020-07-30 ENCOUNTER — Ambulatory Visit: Payer: 59 | Admitting: Psychiatry

## 2020-10-23 ENCOUNTER — Other Ambulatory Visit: Payer: Self-pay | Admitting: Psychiatry

## 2020-10-23 DIAGNOSIS — F4312 Post-traumatic stress disorder, chronic: Secondary | ICD-10-CM

## 2020-10-23 DIAGNOSIS — F411 Generalized anxiety disorder: Secondary | ICD-10-CM

## 2020-12-22 NOTE — Progress Notes (Signed)
PCP:  Francene Finders, MD Primary Cardiologist: None Electrophysiologist: Sherryl Manges, MD   Dustin Conner is a 25 y.o. male seen today for Sherryl Manges, MD for routine electrophysiology followup.  Since last being seen in our clinic the patient reports doing OK overall. He has palpitations 1-2 times a week.   He has noted triggers as:  Greater than 12 hrs last dose of lopressor Longer times between meals Inadequate hydration  Mother cites several episodes where he has had to lie down to feel better, and she can almost see his chest "pounding". He snores very heavily with questionable periods of apnea which causes his mother much distress. No chest pain.  He has a lightheadedness at times. Initially describes it as vertiginous with rapid head turning, but states it correlates as well with hydration and symptoms.  He has not noted clear correlation with symptoms and caffeine.   One particular severe episode involved spending the day out on the boat, inadequate hydration, hopping out and walking up hill. He felt very poorly and had to lie down for some time to feel better.   Past Medical History:  Diagnosis Date   Abnormal electrocardiogram    Anxiety    Past Surgical History:  Procedure Laterality Date   BACK SURGERY     CIRCUMCISION      Current Outpatient Medications  Medication Sig Dispense Refill   alprazolam (XANAX) 2 MG tablet Take 1/2-1 tab po qd prn anxiety 10 tablet 5   ASHWAGANDHA PO Take by mouth.     Aspirin-Salicylamide-Caffeine (BC HEADACHE POWDER PO) Take by mouth.     ibuprofen (ADVIL) 400 MG tablet Take 400 mg by mouth every 6 (six) hours as needed.     metoprolol tartrate (LOPRESSOR) 50 MG tablet Take 1 tablet (50 mg total) by mouth 2 (two) times daily. 180 tablet 3   sertraline (ZOLOFT) 100 MG tablet Take 2 tablets (200 mg total) by mouth at bedtime. 180 tablet 1   No current facility-administered medications for this visit.    Allergies  Allergen  Reactions   Crab [Shellfish Allergy]     Social History   Socioeconomic History   Marital status: Single    Spouse name: Not on file   Number of children: Not on file   Years of education: Not on file   Highest education level: Not on file  Occupational History   Not on file  Tobacco Use   Smoking status: Some Days   Smokeless tobacco: Current    Types: Snuff  Substance and Sexual Activity   Alcohol use: Yes    Comment: Reports drinking typically only on weekends.    Drug use: Not Currently    Types: Marijuana   Sexual activity: Not on file  Other Topics Concern   Not on file  Social History Narrative   ** Merged History Encounter **       Social Determinants of Health   Financial Resource Strain: Not on file  Food Insecurity: Not on file  Transportation Needs: Not on file  Physical Activity: Not on file  Stress: Not on file  Social Connections: Not on file  Intimate Partner Violence: Not on file    Review of Systems: All other systems reviewed and are otherwise negative except as noted above.  Physical Exam: Vitals:   12/23/20 0822  BP: 122/84  Pulse: 71  SpO2: 97%  Weight: 286 lb (129.7 kg)  Height: 6\' 1"  (1.854 m)  GEN- The patient is well appearing, alert and oriented x 3 today.   HEENT: normocephalic, atraumatic; sclera clear, conjunctiva pink; hearing intact; oropharynx clear; neck supple, no JVP Lymph- no cervical lymphadenopathy Lungs- Clear to ausculation bilaterally, normal work of breathing.  No wheezes, rales, rhonchi Heart- Regular rate and rhythm, no murmurs, rubs or gallops, PMI not laterally displaced GI- soft, non-tender, non-distended, bowel sounds present, no hepatosplenomegaly Extremities- no clubbing, cyanosis, or edema; DP/PT/radial pulses 2+ bilaterally MS- no significant deformity or atrophy Skin- warm and dry, no rash or lesion Psych- euthymic mood, full affect Neuro- strength and sensation are intact  EKG is ordered.  Personal review of EKG from today shows NSR at 71 bpm with stable intervals  Additional studies reviewed include: Previous EP office notes.   Assessment and Plan:  Abnormal ECG   Sinus tachycardia   Elevated blood pressure   Anxiety/anger/depression   ?sleep disordered breathing  EKG today shows NSR at 71 bpm with stable intervals  Tachycardia overall well controlled with clear triggers. He takes his BID medication at varying times and notices a difference when he waits for > 12 hours. He would like to try long acting. Will change Lopressor 50 mg BID to 100 mg daily TOPROL  Can use lopressor 25 mg prn for breakthrough, but more importantly needs to avoid clear triggers that we identified together today. We discussed the role of salt and water repletion, the importance of exercise, often needing to be started in the recumbent position, and the awareness of triggers and the role of ambient heat and dehydration  BP stable today.   Anxiety stable on sertraline. Lots of anxiety by patient and mother when he has heart racing and pounding episodes. I encouraged them to get a kardia mobile/alive cor. I expect re-assurance can be given based on what this shows during episodes, alternatively, it may also reveal arrhythmias. Either way beneficial.   Heavy snoring noted. He is not overweight in a traditional sense but very muscular. I will ask Dr. Graciela Husbands how he typically approaches this in patients with dysautonomic type symptoms. Pt wished to defer for now and re-visit at follow up.  Labs today. I have recommended f/u with Dr. Graciela Husbands in 3 months to further assess symptoms.   Graciella Freer, PA-C  12/23/20 8:32 AM

## 2020-12-23 ENCOUNTER — Other Ambulatory Visit: Payer: Self-pay

## 2020-12-23 ENCOUNTER — Ambulatory Visit: Payer: 59 | Admitting: Student

## 2020-12-23 VITALS — BP 122/84 | HR 71 | Ht 73.0 in | Wt 286.0 lb

## 2020-12-23 DIAGNOSIS — I10 Essential (primary) hypertension: Secondary | ICD-10-CM | POA: Diagnosis not present

## 2020-12-23 DIAGNOSIS — I471 Supraventricular tachycardia: Secondary | ICD-10-CM

## 2020-12-23 DIAGNOSIS — R000 Tachycardia, unspecified: Secondary | ICD-10-CM

## 2020-12-23 LAB — COMPREHENSIVE METABOLIC PANEL
ALT: 42 IU/L (ref 0–44)
AST: 24 IU/L (ref 0–40)
Albumin/Globulin Ratio: 2.5 — ABNORMAL HIGH (ref 1.2–2.2)
Albumin: 4.9 g/dL (ref 4.1–5.2)
Alkaline Phosphatase: 78 IU/L (ref 44–121)
BUN/Creatinine Ratio: 15 (ref 9–20)
BUN: 14 mg/dL (ref 6–20)
Bilirubin Total: 0.2 mg/dL (ref 0.0–1.2)
CO2: 22 mmol/L (ref 20–29)
Calcium: 9.8 mg/dL (ref 8.7–10.2)
Chloride: 102 mmol/L (ref 96–106)
Creatinine, Ser: 0.91 mg/dL (ref 0.76–1.27)
Globulin, Total: 2 g/dL (ref 1.5–4.5)
Glucose: 81 mg/dL (ref 65–99)
Potassium: 4.5 mmol/L (ref 3.5–5.2)
Sodium: 141 mmol/L (ref 134–144)
Total Protein: 6.9 g/dL (ref 6.0–8.5)
eGFR: 120 mL/min/{1.73_m2} (ref >59)

## 2020-12-23 LAB — CBC
Hematocrit: 43.7 % (ref 37.5–51.0)
Hemoglobin: 14.9 g/dL (ref 13.0–17.7)
MCH: 29.6 pg (ref 26.6–33.0)
MCHC: 34.1 g/dL (ref 31.5–35.7)
MCV: 87 fL (ref 79–97)
Platelets: 264 10*3/uL (ref 150–450)
RBC: 5.04 x10E6/uL (ref 4.14–5.80)
RDW: 11.7 % (ref 11.6–15.4)
WBC: 9.4 10*3/uL (ref 3.4–10.8)

## 2020-12-23 LAB — TSH: TSH: 1.05 u[IU]/mL (ref 0.450–4.500)

## 2020-12-23 MED ORDER — METOPROLOL TARTRATE 25 MG PO TABS: 25.0000 mg | ORAL_TABLET | Freq: Every day | ORAL | 3 refills | Status: DC | PRN

## 2020-12-23 MED ORDER — METOPROLOL SUCCINATE ER 100 MG PO TB24: 100.0000 mg | ORAL_TABLET | Freq: Every day | ORAL | 3 refills | Status: DC

## 2020-12-23 NOTE — Patient Instructions (Signed)
Medication Instructions:  Your physician has recommended you make the following change in your medication:   START: Metoprolol Succinate 100mg  daily CHANGE: Metoprolo Tartrate 25mg  as needed  *If you need a refill on your cardiac medications before your next appointment, please call your pharmacy*   Lab Work: TODAY: CMET, CBC, TSH  If you have labs (blood work) drawn today and your tests are completely normal, you will receive your results only by: MyChart Message (if you have MyChart) OR A paper copy in the mail If you have any lab test that is abnormal or we need to change your treatment, we will call you to review the results.   Follow-Up: At [facility], you and your health needs are our priority.  As part of our continuing mission to provide you with exceptional heart care, we have created designated Provider Care Teams.  These Care Teams include your primary Cardiologist (physician) and Advanced Practice Providers (APPs -  Physician Assistants and Nurse Practitioners) who all work together to provide you with the care you need, when you need it.  We recommend signing up for the patient portal called "MyChart".  Sign up information is provided on this After Visit Summary.  MyChart is used to connect with patients for Virtual Visits (Telemedicine).  Patients are able to view lab/test results, encounter notes, upcoming appointments, etc.  Non-urgent messages can be sent to your provider as well.   To learn more about what you can do with MyChart, go to .    Your next appointment:   3 month(s)  The format for your next appointment:   In Person  Provider:   [facility], MD   [employer]  FDA-cleared EKG at your fingertips. - [employer]   [email or web address], Sherryl Manges. [email or web address]   FDA-cleared, clinical grade mobile EKG monitor: Bank[employer] is the most clinically-validated mobile EKG used by the world's leading  cardiac care medical professionals.  This may be useful in monitoring palpitations.  We do not have access to have them emailed and reviewed but will be glad to review while in the office.

## 2021-03-11 ENCOUNTER — Other Ambulatory Visit: Payer: Self-pay | Admitting: *Deleted

## 2021-03-11 MED ORDER — METOPROLOL SUCCINATE ER 100 MG PO TB24: 100.0000 mg | ORAL_TABLET | Freq: Every day | ORAL | 3 refills | Status: DC

## 2021-04-18 ENCOUNTER — Ambulatory Visit (INDEPENDENT_AMBULATORY_CARE_PROVIDER_SITE_OTHER): Payer: Self-pay | Admitting: Internal Medicine

## 2021-04-18 ENCOUNTER — Other Ambulatory Visit: Payer: Self-pay

## 2021-04-18 ENCOUNTER — Encounter: Payer: Self-pay | Admitting: Internal Medicine

## 2021-04-18 VITALS — BP 120/80 | Ht 73.0 in | Wt 304.0 lb

## 2021-04-18 DIAGNOSIS — R000 Tachycardia, unspecified: Secondary | ICD-10-CM

## 2021-04-18 NOTE — Progress Notes (Deleted)
      Patient Care Team: Francene Finders, MD as PCP - General (Internal Medicine) Duke Salvia, MD as PCP - Electrophysiology (Cardiology) Lavada Mesi, MD (Family Medicine)   HPI  Dustin Conner is a 25 y.o. male Seen in followup for tachycardia -sinus Rx with BB initially propranolol>> metoprolol tart>meto succinate  Also anxiety and anger/depression, better on sertaline  The patient denies chest pain***, shortness of breath***, nocturnal dyspnea***, orthopnea*** or peripheral edema***.  There have been no palpitations***, lightheadedness*** or syncope***.  Complains of ***.    Records and Results Reviewed***  Past Medical History:  Diagnosis Date   Abnormal electrocardiogram    Anxiety     Past Surgical History:  Procedure Laterality Date   BACK SURGERY     CIRCUMCISION      No outpatient medications have been marked as taking for the 04/18/21 encounter (Appointment) with Duke Salvia, MD.    Allergies  Allergen Reactions   Parke Simmers Allergy]       Review of Systems negative except from HPI and PMH  Physical Exam There were no vitals taken for this visit. Well developed and well nourished in no acute distress HENT normal E scleral and icterus clear Neck Supple JVP flat; carotids brisk and full Clear to ausculation {CARD RHYTHM:10874} ***Regular rate and rhythm, no murmurs gallops or rub Soft with active bowel sounds No clubbing cyanosis {Numbers; edema:17696} Edema Alert and oriented, grossly normal motor and sensory function Skin Warm and Dry  ECG ***  CrCl cannot be calculated (Patient's most recent lab result is older than the maximum 21 days allowed.).   Assessment and  Plan Abnormal ECG   Sinus tachycardia   Elevated blood pressure   Anxiety/anger/depression    With his tachycardia which has been present before it is elevated blood pressure, recommended that he try the propranolol previously prescribed 20 mg twice  daily to see if it does not attenuate some of the adrenergic stimulation.  My understanding is that while beta-blockers are associated with some degree of psychomotor retardation they do not aggravate depression.       He is spoken to his mother about these issues.  He made a call to mental health.  They referred him to the emergency room.  He declined to go.   I have reached out to [facility] at: [employer].  He is working to try to facilitate a visit.  I strongly encouraged the patient that if he becomes increasingly concerned that he is at risk to himself that he take himself to the emergency room not withstanding the issues.  He says that he is disabled his guns so as to have    Current medicines are reviewed at length with the patient today .  The patient does not*** have concerns regarding medicines.

## 2021-04-18 NOTE — Patient Instructions (Addendum)
Medication Instructions:  Your physician recommends that you continue on your current medications as directed. Please refer to the Current Medication list given to you today.   ** Stop Metoprolol  Prescriptions given for   Nebivolol 5mg  - 1 tablet by mouth daily  Bisoprolol Fumarate 5mg  - 1 tablet by mouth daily.  Per Dr choose 1 of the beta blocker prescriptions and fill.  Take as prescribed.  Let know which medication you choose to continue on.  *If you need a refill on your cardiac medications before your next appointment, please call your pharmacy*   Lab Work: None ordered.  If you have labs (blood work) drawn today and your tests are completely normal, you will receive your results only by: MyChart Message (if you have MyChart) OR A paper copy in the mail If you have any lab test that is abnormal or we need to change your treatment, we will call you to review the results.   Testing/Procedures: None ordered.    Follow-Up: At [facility], you and your health needs are our priority.  As part of our continuing mission to provide you with exceptional heart care, we have created designated Provider Care Teams.  These Care Teams include your primary Cardiologist (physician) and Advanced Practice Providers (APPs -  Physician Assistants and Nurse Practitioners) who all work together to provide you with the care you need, when you need it.  We recommend signing up for the patient portal called "MyChart".  Sign up information is provided on this After Visit Summary.  MyChart is used to connect with patients for Virtual Visits (Telemedicine).  Patients are able to view lab/test results, encounter notes, upcoming appointments, etc.  Non-urgent messages can be sent to your provider as well.   To learn more about what you can do with MyChart, go to [place].    Your next appointment:   6 months with [facility], PA-C

## 2021-04-18 NOTE — Progress Notes (Signed)
Patient Care Team: Francene Finders, MD as PCP - General (Internal Medicine) Duke Salvia, MD as PCP - Electrophysiology (Cardiology) Lavada Mesi, MD (Family Medicine)   HPI  Dustin Conner is a 25 y.o. male Seen in followup for tachycardia -sinus Rx with BB initially propranolol>> metoprolol tart>meto succinate  Also anxiety and anger/depression, better on sertaline  Heart rate is between 65-85, sometimes 95. Blood pressure is 113-118/75. He has a machine that measures both, at home. He states that sometimes he wakes up tired in the morning, but does not mention anything unusual. He denies fatigue or lack of energy, and that his anxiety has been well controlled. In regards to the anxiety, he has stopped taking Xanax for the time being, instead managing anxiety and stress through relief activities like reading, or watching football.   Also, he admits that his anger issues have been doing well. He denies chest pain, but has noticed pedal edema which started a couple weeks ago.    He states that since having started the Metoprolol that he has had difficulty managing his weight, stating that he has gained 20 pounds.  For meals, he eats a light breakfast generally, tries to avoid fast food, and eats at home for dinner that he cooks himself. He cooks a lot of red meat, starchy foods like potatoes, and vegetables. If he has something sweet or sugary for a dessert item, it is in small quantities.  He denies smoking, and drinks alcohol on the weekends. He states that he drank 4-5 in a day during the weekend.  Records and Results Reviewed   Past Medical History:  Diagnosis Date   Abnormal electrocardiogram    Anxiety     Past Surgical History:  Procedure Laterality Date   BACK SURGERY     CIRCUMCISION      Current Meds  Medication Sig   alprazolam (XANAX) 2 MG tablet Take 1/2-1 tab po qd prn anxiety   ASHWAGANDHA PO Take by mouth.   Aspirin-Salicylamide-Caffeine (BC  HEADACHE POWDER PO) Take by mouth.   ibuprofen (ADVIL) 400 MG tablet Take 400 mg by mouth every 6 (six) hours as needed.   metoprolol succinate (TOPROL-XL) 100 MG 24 hr tablet Take 1 tablet (100 mg total) by mouth daily. Take with or immediately following a meal.   metoprolol tartrate (LOPRESSOR) 25 MG tablet Take 1 tablet (25 mg total) by mouth daily as needed.    Allergies  Allergen Reactions   Crab [Shellfish Allergy]       Review of Systems negative except from HPI and PMH  Physical Exam BP 120/80 (BP Location: Left Arm, Patient Position: Sitting, Cuff Size: Normal)   Ht 6\' 1"  (1.854 m)   Wt (!) 304 lb (137.9 kg)   BMI 40.11 kg/m  Well developed and well nourished in no acute distress HENT normal E scleral and icterus clear Neck Supple JVP flat; carotids brisk and full Clear to ausculation Regular rate and rhythm, no murmurs gallops or rub Soft with active bowel sounds No clubbing cyanosis  Edema Alert and oriented, grossly normal motor and sensory function Skin Warm and Dry  ECG   CrCl cannot be calculated (Patient's most recent lab result is older than the maximum 21 days allowed.).   Assessment and  Plan Abnormal ECG   Sinus tachycardia   Elevated blood pressure   Anxiety/anger/depression   Weight Gain   Tachycardia is much improved on the beta-blockers however, he is struggling  with weight gain despite stable diet.  Literature review suggests her on beta-blockers can be associated with this; hence, we will prescribe a newer beta-blocker, and have given a prescription for bisoprolol 5 nebivolol 5.  We reviewed the cost so the   cost plus [employer]com.-  Anger anxiety is much improved.  Managed with sleep.  Probably has sleep apnea with elevated blood pressure and sleep disordered breathing but does not have significant daytime somnolence.  We will hold off on a further evaluation at this time   Current medicines are reviewed at length with the patient today  .  The patient does have concerns regarding medicines (As above)    I,Jordan Kelly,acting as a scribe for Sherryl Manges, MD.,have documented all relevant documentation on the behalf of Sherryl Manges, MD,as directed by  Sherryl Manges, MD while in the presence of Sherryl Manges, MD. I, Sherryl Manges, MD, have reviewed all documentation for this visit. The documentation on 04/18/21 for the exam, diagnosis, procedures, and orders are all accurate and complete.
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
## 2022-03-16 ENCOUNTER — Other Ambulatory Visit: Payer: Self-pay | Admitting: Internal Medicine

## 2022-04-19 ENCOUNTER — Other Ambulatory Visit: Payer: Self-pay | Admitting: Internal Medicine

## 2022-04-27 ENCOUNTER — Other Ambulatory Visit: Payer: Self-pay

## 2022-04-27 MED ORDER — METOPROLOL TARTRATE 25 MG PO TABS: 25.0000 mg | ORAL_TABLET | Freq: Every day | ORAL | 0 refills | Status: DC | PRN

## 2022-04-27 NOTE — Telephone Encounter (Signed)
Pt's medication was sent to pt's pharmacy as requested. Confirmation received.  °

## 2022-05-20 ENCOUNTER — Other Ambulatory Visit: Payer: Self-pay | Admitting: Internal Medicine

## 2022-05-24 NOTE — Progress Notes (Signed)
   PCP:  Feliz Beam, MD Primary Cardiologist: None Electrophysiologist: Virl Axe, MD   Dustin Conner is a 27 y.o. male seen today for Virl Axe, MD for routine electrophysiology followup. Since last being seen in our clinic the patient reports doing well overall. He ran out of bisoprolol and called for refill, but was instead given a refill of low dose lopressor.  He has had more tachycardia and SOB on this lower dose.  he denies chest pain, PND, orthopnea, nausea, vomiting, dizziness, syncope, edema, weight gain, or early satiety.   Past Medical History:  Diagnosis Date   Abnormal electrocardiogram    Anxiety     Current Outpatient Medications  Medication Instructions   alprazolam (XANAX) 2 MG tablet Take 1/2-1 tab po qd prn anxiety   ASHWAGANDHA PO Oral   Aspirin-Salicylamide-Caffeine (BC HEADACHE POWDER PO) Oral   ibuprofen (ADVIL) 400 mg, Oral, Every 6 hours PRN   metoprolol tartrate (LOPRESSOR) 25 mg, Oral, Daily PRN    Physical Exam: Vitals:   05/26/22 0956  BP: 124/80  Pulse: 74  SpO2: 97%  Weight: (!) 314 lb (142.4 kg)  Height: 6\' 1"  (1.854 m)    GEN- The patient is well appearing, alert and oriented x 3 today.   HEENT: normocephalic, atraumatic Lungs- Clear to ausculation bilaterally, normal work of breathing.  No wheezes, rales, rhonchi Heart- Regular rate and rhythm, no murmurs, rubs or gallops, PMI not laterally displaced Extremities- No peripheral edema. no clubbing or cyanosis; DP/PT/radial pulses 2+ bilaterally Skin- warm and dry, no rash or lesion Psych- euthymic mood, full affect  EKG is ordered today. Personal review shows NSR at 74 bpm  Additional studies reviewed include: Previous EP notes.   Assessment and Plan:  Abnormal ECG   Sinus tachycardia   Elevated blood pressure   Anxiety/anger/depression    ?sleep disordered breathing  Tachycardia somewhat worse on low dose lopressor. Will place back on Toprol which he overall  felt well on. He didn't notice a change in his fatigue between bisoprolol vs toprol.   Anger/anxiety well controlled.   There has been some concern for possible sleep apnea with HTN and snoring, but no daytime fatigue so holding off further eval at this time.   Labs today.   Follow up with Dr. Caryl Comes in in 34 months  Shirley Friar, PA-C  05/26/22 10:08 AM

## 2022-05-26 ENCOUNTER — Ambulatory Visit: Payer: Self-pay | Attending: Student | Admitting: Student

## 2022-05-26 ENCOUNTER — Encounter: Payer: Self-pay | Admitting: Student

## 2022-05-26 VITALS — BP 124/80 | HR 74 | Ht 73.0 in | Wt 314.0 lb

## 2022-05-26 DIAGNOSIS — I10 Essential (primary) hypertension: Secondary | ICD-10-CM

## 2022-05-26 DIAGNOSIS — I471 Supraventricular tachycardia, unspecified: Secondary | ICD-10-CM

## 2022-05-26 DIAGNOSIS — R000 Tachycardia, unspecified: Secondary | ICD-10-CM

## 2022-05-26 MED ORDER — METOPROLOL SUCCINATE ER 100 MG PO TB24: 100.0000 mg | ORAL_TABLET | Freq: Every day | ORAL | 3 refills | Status: AC

## 2022-05-26 NOTE — Patient Instructions (Signed)
Medication Instructions:  Your physician has recommended you make the following change in your medication:   START: Metoprolol Succinate 100mg  daily  *If you need a refill on your cardiac medications before your next appointment, please call your pharmacy*   Lab Work: TODAY: BMET, CBC  If you have labs (blood work) drawn today and your tests are completely normal, you will receive your results only by: [place] (if you have MyChart) OR A paper copy in the mail If you have any lab test that is abnormal or we need to change your treatment, we will call you to review the results.   Follow-Up: At [facility], you and your health needs are our priority.  As part of our continuing mission to provide you with exceptional heart care, we have created designated Provider Care Teams.  These Care Teams include your primary Cardiologist (physician) and Advanced Practice Providers (APPs -  Physician Assistants and Nurse Practitioners) who all work together to provide you with the care you need, when you need it.   Your next appointment:   1 year(s)  Provider:   Virl Axe, MD

## 2022-05-27 LAB — CBC
Hematocrit: 44.6 % (ref 37.5–51.0)
Hemoglobin: 14.8 g/dL (ref 13.0–17.7)
MCH: 29.4 pg (ref 26.6–33.0)
MCHC: 33.2 g/dL (ref 31.5–35.7)
MCV: 89 fL (ref 79–97)
Platelets: 277 10*3/uL (ref 150–450)
RBC: 5.03 x10E6/uL (ref 4.14–5.80)
RDW: 12.2 % (ref 11.6–15.4)
WBC: 7.6 10*3/uL (ref 3.4–10.8)

## 2022-05-27 LAB — BASIC METABOLIC PANEL
BUN/Creatinine Ratio: 15 (ref 9–20)
BUN: 15 mg/dL (ref 6–20)
CO2: 22 mmol/L (ref 20–29)
Calcium: 10.1 mg/dL (ref 8.7–10.2)
Chloride: 102 mmol/L (ref 96–106)
Creatinine, Ser: 0.99 mg/dL (ref 0.76–1.27)
Glucose: 91 mg/dL (ref 70–99)
Potassium: 4.4 mmol/L (ref 3.5–5.2)
Sodium: 140 mmol/L (ref 134–144)
eGFR: 107 mL/min/{1.73_m2} (ref >59)

## 2022-08-28 ENCOUNTER — Other Ambulatory Visit: Payer: Self-pay | Admitting: Internal Medicine

## 2023-03-28 ENCOUNTER — Encounter: Payer: Self-pay | Admitting: Psychiatry
